# Patient Record
Sex: Female | Born: 1955 | Race: Black or African American | Hispanic: No | Marital: Married | Smoking: Never smoker
Health system: Southern US, Community
[De-identification: ages and names within clinical notes are randomized; demographics above are authoritative.]

## PROBLEM LIST (undated history)

## (undated) ENCOUNTER — Emergency Department (HOSPITAL_BASED_OUTPATIENT_CLINIC_OR_DEPARTMENT_OTHER): Admission: EM | Payer: BLUE CROSS/BLUE SHIELD | Source: Home / Self Care

## (undated) DIAGNOSIS — K219 Gastro-esophageal reflux disease without esophagitis: Secondary | ICD-10-CM

## (undated) DIAGNOSIS — G500 Trigeminal neuralgia: Secondary | ICD-10-CM

## (undated) HISTORY — PX: CHOLECYSTECTOMY: SHX55

## (undated) HISTORY — PX: ABDOMINAL HYSTERECTOMY: SHX81

---

## 2017-02-27 ENCOUNTER — Emergency Department (HOSPITAL_BASED_OUTPATIENT_CLINIC_OR_DEPARTMENT_OTHER): Payer: BLUE CROSS/BLUE SHIELD

## 2017-02-27 ENCOUNTER — Emergency Department (HOSPITAL_BASED_OUTPATIENT_CLINIC_OR_DEPARTMENT_OTHER)
Admission: EM | Admit: 2017-02-27 | Discharge: 2017-02-27 | Disposition: A | Discharge status: Home / Self Care | Payer: BLUE CROSS/BLUE SHIELD | Attending: Emergency Medicine | Admitting: Emergency Medicine

## 2017-02-27 ENCOUNTER — Encounter (HOSPITAL_BASED_OUTPATIENT_CLINIC_OR_DEPARTMENT_OTHER): Payer: Self-pay | Admitting: Emergency Medicine

## 2017-02-27 DIAGNOSIS — R0789 Other chest pain: Secondary | ICD-10-CM

## 2017-02-27 DIAGNOSIS — R079 Chest pain, unspecified: Secondary | ICD-10-CM | POA: Diagnosis present

## 2017-02-27 DIAGNOSIS — R072 Precordial pain: Secondary | ICD-10-CM | POA: Insufficient documentation

## 2017-02-27 DIAGNOSIS — Z79899 Other long term (current) drug therapy: Secondary | ICD-10-CM | POA: Diagnosis not present

## 2017-02-27 HISTORY — DX: Trigeminal neuralgia: G50.0

## 2017-02-27 HISTORY — DX: Gastro-esophageal reflux disease without esophagitis: K21.9

## 2017-02-27 LAB — COMPREHENSIVE METABOLIC PANEL
ALT: 18 U/L (ref 14–54)
ANION GAP: 8 (ref 5–15)
AST: 24 U/L (ref 15–41)
Albumin: 4 g/dL (ref 3.5–5.0)
Alkaline Phosphatase: 89 U/L (ref 38–126)
BUN: 11 mg/dL (ref 6–20)
CHLORIDE: 106 mmol/L (ref 101–111)
CO2: 26 mmol/L (ref 22–32)
Calcium: 9.2 mg/dL (ref 8.9–10.3)
Creatinine, Ser: 1.03 mg/dL — ABNORMAL HIGH (ref 0.44–1.00)
GFR calc non Af Amer: 58 mL/min — ABNORMAL LOW (ref >60)
Glucose, Bld: 108 mg/dL — ABNORMAL HIGH (ref 65–99)
POTASSIUM: 3.4 mmol/L — AB (ref 3.5–5.1)
SODIUM: 140 mmol/L (ref 135–145)
Total Bilirubin: 0.3 mg/dL (ref 0.3–1.2)
Total Protein: 7.4 g/dL (ref 6.5–8.1)

## 2017-02-27 LAB — CBC WITH DIFFERENTIAL/PLATELET
Basophils Absolute: 0 10*3/uL (ref 0.0–0.1)
Basophils Relative: 0 %
EOS ABS: 0.2 10*3/uL (ref 0.0–0.7)
EOS PCT: 3 %
HCT: 36.7 % (ref 36.0–46.0)
Hemoglobin: 12 g/dL (ref 12.0–15.0)
LYMPHS ABS: 2 10*3/uL (ref 0.7–4.0)
Lymphocytes Relative: 30 %
MCH: 28.1 pg (ref 26.0–34.0)
MCHC: 32.7 g/dL (ref 30.0–36.0)
MCV: 85.9 fL (ref 78.0–100.0)
MONO ABS: 0.5 10*3/uL (ref 0.1–1.0)
Monocytes Relative: 8 %
Neutro Abs: 4 10*3/uL (ref 1.7–7.7)
Neutrophils Relative %: 59 %
PLATELETS: 310 10*3/uL (ref 150–400)
RBC: 4.27 MIL/uL (ref 3.87–5.11)
RDW: 13.3 % (ref 11.5–15.5)
WBC: 6.7 10*3/uL (ref 4.0–10.5)

## 2017-02-27 LAB — TROPONIN I: Troponin I: <0.03 ng/mL (ref <0.03)

## 2017-02-27 MED ORDER — ASPIRIN 81 MG PO CHEW
324.0000 mg | CHEWABLE_TABLET | Freq: Once | ORAL | Status: AC
  Administered 2017-02-27: 324 mg via ORAL
  Filled 2017-02-27: qty 4

## 2017-02-27 MED ORDER — KETOROLAC TROMETHAMINE 15 MG/ML IJ SOLN
15.0000 mg | Freq: Once | INTRAMUSCULAR | Status: AC
Start: 2017-02-27 — End: 2017-02-27
  Administered 2017-02-27: 15 mg via INTRAVENOUS
  Filled 2017-02-27: qty 1

## 2017-02-27 NOTE — ED Triage Notes (Signed)
Pt awoke with sharp sub-sternal chest pain about 30 mins PTA.

## 2017-02-27 NOTE — ED Provider Notes (Signed)
[facility] Provider Note: Lowella Dell, MD, FACEP  CSN: [id number] MRN: [id number] ARRIVAL: 02/27/17 at 0003 ROOM: [facility]   CHIEF COMPLAINT  Chest Pain   HISTORY OF PRESENT ILLNESS  Hannah Livingston is a 61 y.o. female who is lying in bed about midnight when she felt the sudden onset of chest pain. She describes the chest pain as feeling like a tunnel bricks were sitting on her chest. She rated her pain as a 10 out of 10 at its worst but a 4 out of 10 now. There was no radiation. There has been no associated shortness of breath, diaphoresis or nausea. Pain is worse with movement, palpation and lying supine. It is improved sitting upright. There is an associated burning sensation in her upper back.   Past Medical History:  Diagnosis Date  . GERD (gastroesophageal reflux disease)   . Trigeminal neuralgia     Past Surgical History:  Procedure Laterality Date  . ABDOMINAL HYSTERECTOMY    . CHOLECYSTECTOMY      No family history on file.  Social History  Substance Use Topics  . Smoking status: Never Smoker  . Smokeless tobacco: Never Used  . Alcohol use No    Prior to Admission medications   Medication Sig Start Date End Date Taking? Authorizing Provider  buPROPion (WELLBUTRIN XL) 150 MG 24 hr tablet Take 150 mg by mouth daily.   Yes Historical Provider, MD  gabapentin (NEURONTIN) 600 MG tablet Take 600 mg by mouth 3 (three) times daily.   Yes Historical Provider, MD  ondansetron (ZOFRAN-ODT) 4 MG disintegrating tablet Take 4 mg by mouth every 6 (six) hours as needed for nausea or vomiting.   Yes Historical Provider, MD  OXcarbazepine (TRILEPTAL) 150 MG tablet Take 150 mg by mouth 2 (two) times daily.   Yes Historical Provider, MD  pantoprazole (PROTONIX) 40 MG tablet Take 40 mg by mouth 2 (two) times daily.   Yes Historical Provider, MD    Allergies Lyrica [pregabalin]; Morphine and related; Codeine; and Hydrocodone   REVIEW OF SYSTEMS  Negative except  as noted here or in the History of Present Illness.   PHYSICAL EXAMINATION  Initial Vital Signs Blood pressure 136/81, pulse 71, temperature 97.8 F (36.6 C), temperature source Oral, resp. rate 22, height 4\' 11"  (1.499 m), weight 147 lb (66.7 kg), SpO2 100 %.  Examination General: Well-developed, well-nourished female in no acute distress; appearance consistent with age of record HENT: normocephalic; atraumatic Eyes: pupils equal, round and reactive to light; extraocular muscles intact Neck: supple Heart: regular rate and rhythm; no murmurs, rubs or gallops Lungs: clear to auscultation bilaterally Chest: Significant upper sternal tenderness without deformity or crepitus Abdomen: soft; nondistended; nontender; no masses or hepatosplenomegaly; bowel sounds present Extremities: No deformity; full range of motion; pulses normal Neurologic: Awake, alert and oriented; motor function intact in all extremities and symmetric; no facial droop Skin: Warm and dry Psychiatric: Normal mood and affect   RESULTS  Summary of this visit's results, reviewed by myself:   EKG Interpretation  Date/Time:  Thursday February 27 2017 00:10:49 EDT Ventricular Rate:  71 PR Interval:    QRS Duration: 89 QT Interval:  408 QTC Calculation: 444 R Axis:   77 Text Interpretation:  Sinus rhythm Baseline wander in lead(s) III aVF No previous ECGs available Confirmed by Bosten Newstrom  MD, Jonny Ruiz ([id number]) on 02/27/2017 12:50:05 AM      Laboratory Studies: Results for orders placed or performed during the hospital encounter of 02/27/17 (  from the past 24 hour(s))  CBC with Differential     Status: None   Collection Time: 02/27/17 12:25 AM  Result Value Ref Range   WBC 6.7 4.0 - 10.5 K/uL   RBC 4.27 3.87 - 5.11 MIL/uL   Hemoglobin 12.0 12.0 - 15.0 g/dL   HCT 40.9 81.1 - 91.4 %   MCV 85.9 78.0 - 100.0 fL   MCH 28.1 26.0 - 34.0 pg   MCHC 32.7 30.0 - 36.0 g/dL   RDW 78.2 95.6 - 21.3 %   Platelets 310 150 - 400 K/uL    Neutrophils Relative % 59 %   Neutro Abs 4.0 1.7 - 7.7 K/uL   Lymphocytes Relative 30 %   Lymphs Abs 2.0 0.7 - 4.0 K/uL   Monocytes Relative 8 %   Monocytes Absolute 0.5 0.1 - 1.0 K/uL   Eosinophils Relative 3 %   Eosinophils Absolute 0.2 0.0 - 0.7 K/uL   Basophils Relative 0 %   Basophils Absolute 0.0 0.0 - 0.1 K/uL  Comprehensive metabolic panel     Status: Abnormal   Collection Time: 02/27/17 12:25 AM  Result Value Ref Range   Sodium 140 135 - 145 mmol/L   Potassium 3.4 (L) 3.5 - 5.1 mmol/L   Chloride 106 101 - 111 mmol/L   CO2 26 22 - 32 mmol/L   Glucose, Bld 108 (H) 65 - 99 mg/dL   BUN 11 6 - 20 mg/dL   Creatinine, Ser 0.86 (H) 0.44 - 1.00 mg/dL   Calcium 9.2 8.9 - 57.8 mg/dL   Total Protein 7.4 6.5 - 8.1 g/dL   Albumin 4.0 3.5 - 5.0 g/dL   AST 24 15 - 41 U/L   ALT 18 14 - 54 U/L   Alkaline Phosphatase 89 38 - 126 U/L   Total Bilirubin 0.3 0.3 - 1.2 mg/dL   GFR calc non Af Amer 58 (L) >60 mL/min   GFR calc Af Amer >60 >60 mL/min   Anion gap 8 5 - 15  Troponin I     Status: None   Collection Time: 02/27/17 12:25 AM  Result Value Ref Range   Troponin I <0.03 <0.03 ng/mL  Troponin I     Status: None   Collection Time: 02/27/17  3:36 AM  Result Value Ref Range   Troponin I <0.03 <0.03 ng/mL   Imaging Studies: Dg Chest 2 View  Result Date: 02/27/2017 CLINICAL DATA:  Patient woke with substernal chest pain about 30 minutes prior to admission. EXAM: CHEST  2 VIEW COMPARISON:  None. FINDINGS: The heart size and mediastinal contours are within normal limits. Both lungs are clear. The visualized skeletal structures are unremarkable. IMPRESSION: No active cardiopulmonary disease. Electronically Signed   By: Burman Nieves M.D.   On: 02/27/2017 01:40    ED COURSE  Nursing notes and initial vitals signs, including pulse oximetry, reviewed.  Vitals:   02/27/17 0008 02/27/17 0219  BP: 136/81 130/63  Pulse: 71 (!) 59  Resp: 22 14  Temp: 97.8 F (36.6 C)   TempSrc: Oral     SpO2: 100% 96%  Weight: 147 lb (66.7 kg)   Height: 4\' 11"  (1.499 m)    4:23 AM Pain tenderness significantly improved after IV Toradol. Patient's examination is consistent with chest wall pain. The patient does not wish to remain for a third troponin because she is scheduled for a gamma knife procedure for trigeminal neuralgia. She was advised of the importance of following up with her primary  care physician regarding this chest pain or to return if it worsens.  PROCEDURES    ED DIAGNOSES     ICD-9-CM ICD-10-CM   1. Chest wall pain 786.52 R07.89        Paula LibraJohn Maysel Mccolm, MD 02/27/17 [phone]

## 2018-03-20 IMAGING — CR DG CHEST 2V
2 series · 2 of 2 positions shown · non-contrast
Comparison: None.

CLINICAL DATA: Patient woke with substernal chest pain about 30
minutes prior to admission.

EXAM:
CHEST  2 VIEW

[w chest pa]
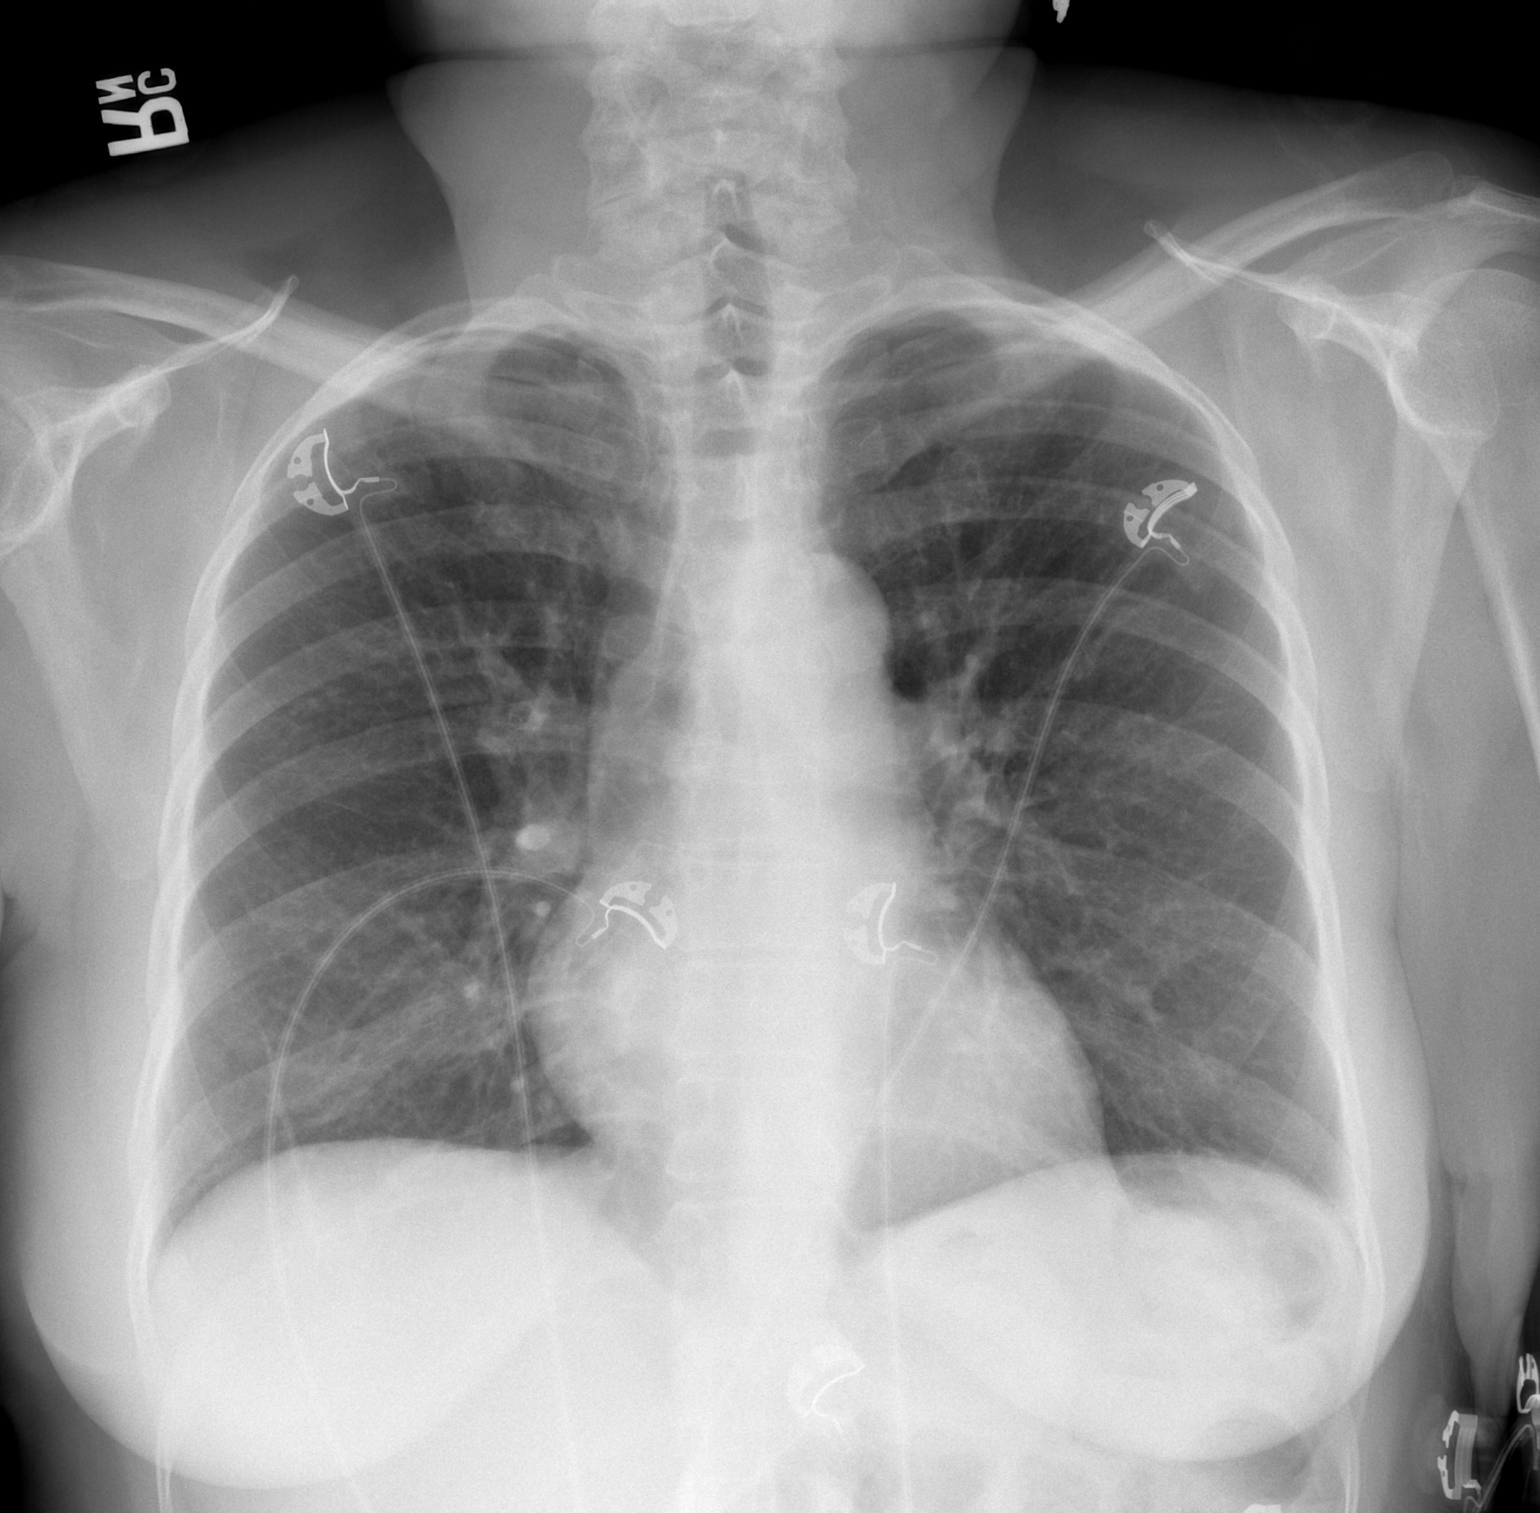

[w chest lat]
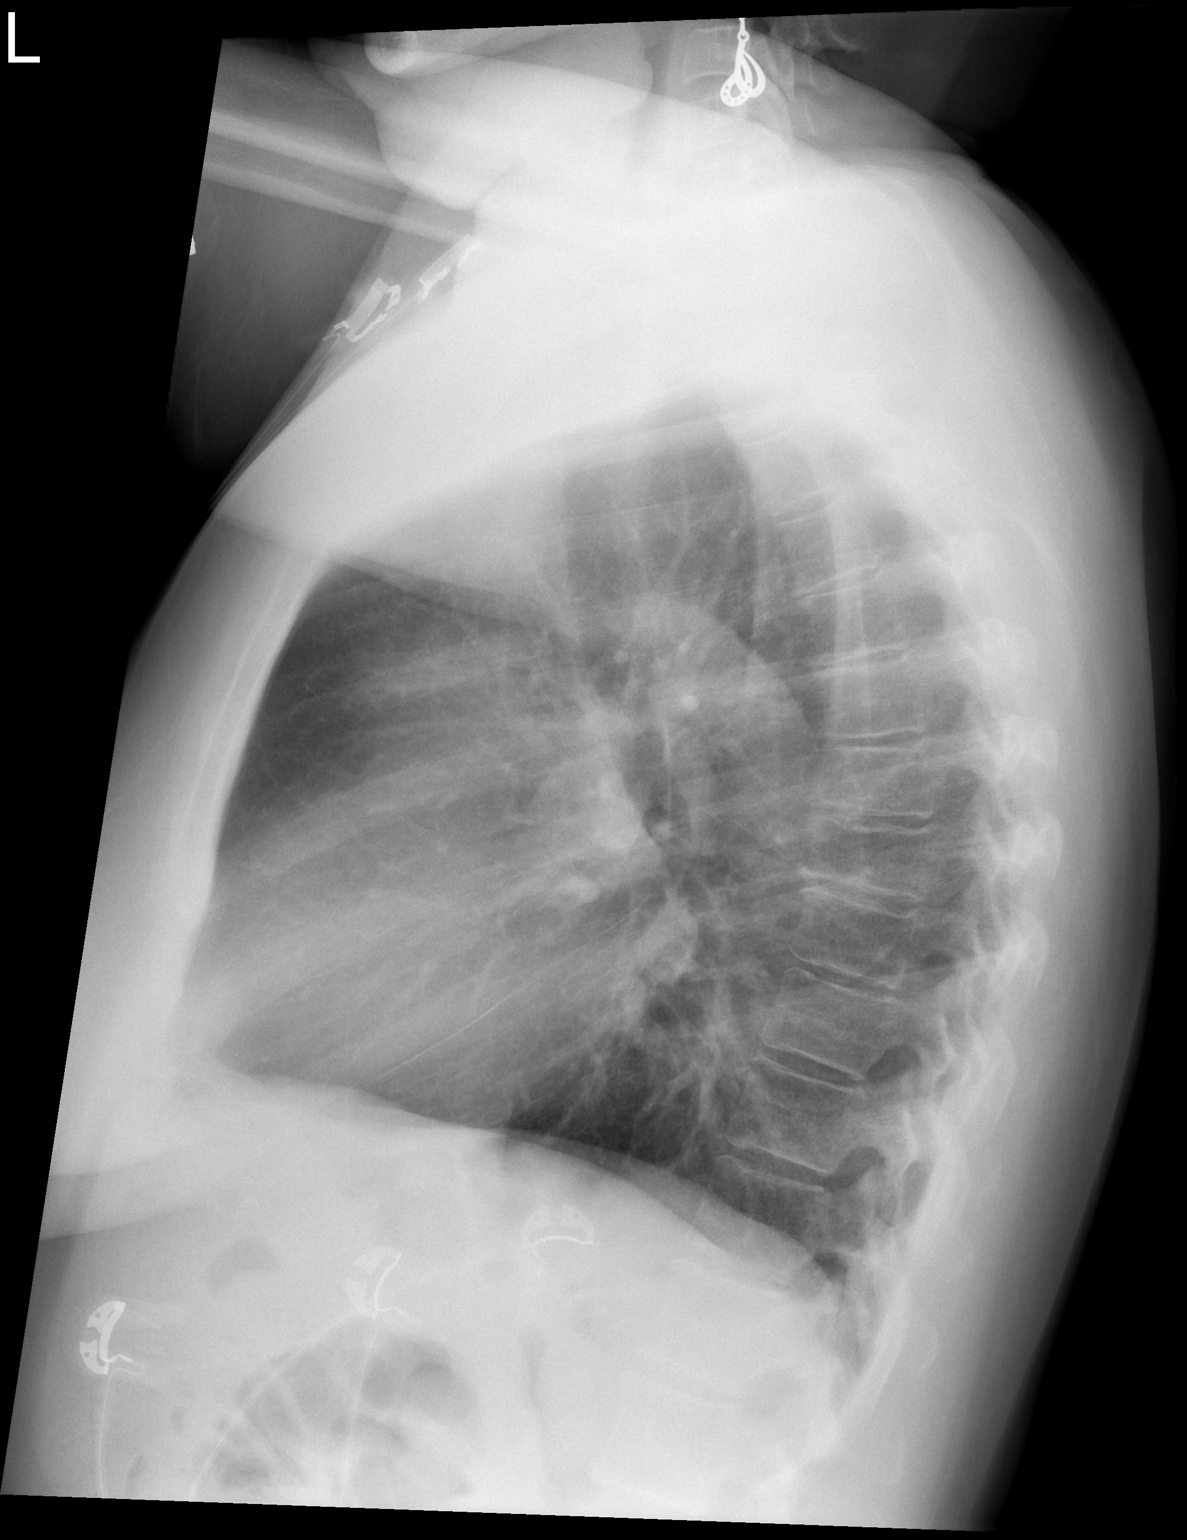

[2 of 2 positions shown; findings below may reference images not displayed]

FINDINGS: The heart size and mediastinal contours are within normal limits.
Both lungs are clear. The visualized skeletal structures are
unremarkable.
IMPRESSION: No active cardiopulmonary disease.
